# Patient Record
Sex: Male | Born: 1995 | Hispanic: No | Marital: Single
Health system: Northeastern US, Academic
[De-identification: ages and names within clinical notes are randomized; demographics above are authoritative.]

---

## 2014-12-23 ENCOUNTER — Encounter: Payer: Self-pay | Admitting: Emergency Medicine

## 2014-12-23 ENCOUNTER — Emergency Department: Payer: Managed Care, Other (non HMO)

## 2014-12-23 ENCOUNTER — Emergency Department
Admission: EM | Admit: 2014-12-23 | Discharge: 2014-12-23 | Disposition: A | Discharge status: Home / Self Care | Payer: Managed Care, Other (non HMO) | Attending: Emergency Medicine | Admitting: Emergency Medicine

## 2014-12-23 DIAGNOSIS — W01198A Fall on same level from slipping, tripping and stumbling with subsequent striking against other object, initial encounter: Secondary | ICD-10-CM | POA: Insufficient documentation

## 2014-12-23 DIAGNOSIS — Z87891 Personal history of nicotine dependence: Secondary | ICD-10-CM | POA: Diagnosis not present

## 2014-12-23 DIAGNOSIS — Y9289 Other specified places as the place of occurrence of the external cause: Secondary | ICD-10-CM | POA: Insufficient documentation

## 2014-12-23 DIAGNOSIS — S0083XA Contusion of other part of head, initial encounter: Secondary | ICD-10-CM | POA: Insufficient documentation

## 2014-12-23 DIAGNOSIS — Y9389 Activity, other specified: Secondary | ICD-10-CM | POA: Diagnosis not present

## 2014-12-23 DIAGNOSIS — Y998 Other external cause status: Secondary | ICD-10-CM | POA: Insufficient documentation

## 2014-12-23 DIAGNOSIS — S0093XA Contusion of unspecified part of head, initial encounter: Secondary | ICD-10-CM

## 2014-12-23 DIAGNOSIS — S0990XA Unspecified injury of head, initial encounter: Secondary | ICD-10-CM | POA: Diagnosis present

## 2014-12-23 MED ORDER — PROMETHAZINE HCL 25 MG PO TABS
25.0000 mg | ORAL_TABLET | Freq: Four times a day (QID) | ORAL | Status: AC | PRN
Start: 2014-12-23 — End: ?

## 2014-12-23 NOTE — ED Notes (Signed)
Pt states he was drinking last night and had about 5 beers, states he doesn't remember what happened "I walked into the room placed my beer on the table then I think my socks slipped out from under me so I tried to catch my balance and placed my hand on the table and then I fell into the table not really sure how it happened I went to bed shortly after. Pt states he felt sick when it happened and woke up feeling the same

## 2014-12-23 NOTE — Discharge Instructions (Signed)
Head Injury, Adult °You have a head injury. Headaches and throwing up (vomiting) are common after a head injury. It should be easy to wake up from sleeping. Sometimes you must stay in the hospital. Most problems happen within the first 24 hours. Side effects may occur up to 7-10 days after the injury.  °WHAT ARE THE TYPES OF HEAD INJURIES? °Head injuries can be as minor as a bump. Some head injuries can be more severe. More severe head injuries include: °· A jarring injury to the brain (concussion). °· A bruise of the brain (contusion). This mean there is bleeding in the brain that can cause swelling. °· A cracked skull (skull fracture). °· Bleeding in the brain that collects, clots, and forms a bump (hematoma). °WHEN SHOULD I GET HELP RIGHT AWAY?  °· You are confused or sleepy. °· You cannot be woken up. °· You feel sick to your stomach (nauseous) or keep throwing up (vomiting). °· Your dizziness or unsteadiness is getting worse. °· You have very bad, lasting headaches that are not helped by medicine. Take medicines only as told by your doctor. °· You cannot use your arms or legs like normal. °· You cannot walk. °· You notice changes in the black spots in the center of the colored part of your eye (pupil). °· You have clear or bloody fluid coming from your nose or ears. °· You have trouble seeing. °During the next 24 hours after the injury, you must stay with someone who can watch you. This person should get help right away (call 911 in the [place] if you start to shake and are not able to control it (have seizures), you pass out, or you are unable to wake up. °HOW CAN I PREVENT A HEAD INJURY IN THE FUTURE? °· Wear seat belts. °· Wear a helmet while bike riding and playing sports like football. °· Stay away from dangerous activities around the house. °WHEN CAN I RETURN TO NORMAL ACTIVITIES AND ATHLETICS? °See your doctor before doing these activities. You should not do normal activities or play contact sports until 1  week after the following symptoms have stopped: °· Headache that does not go away. °· Dizziness. °· Poor attention. °· Confusion. °· Memory problems. °· Sickness to your stomach or throwing up. °· Tiredness. °· Fussiness. °· Bothered by bright lights or loud noises. °· Anxiousness or depression. °· Restless sleep. °MAKE SURE YOU:  °· Understand these instructions. °· Will watch your condition. °· Will get help right away if you are not doing well or get worse. °  °This information is not intended to replace advice given to you by your health care provider. Make sure you discuss any questions you have with your health care provider. °  °Document Released: 01/30/2008 Document Revised: 03/09/2014 Document Reviewed: 10/24/2012 °Elsevier Interactive Patient Education ©2016 [employer] ° °

## 2014-12-23 NOTE — ED Notes (Signed)
Pt states he was drinking yesterday and fell and hit his head. Pt states today he is nauseous, pt presents alert and oriented, denies vision changes. Pt states he hit his head then went to bed and fell asleep.

## 2014-12-23 NOTE — ED Provider Notes (Signed)
[facility] Emergency Department Provider Note  ____________________________________________  Time seen: Approximately 4:11 PM  I have reviewed the triage vital signs and the nursing notes.   HISTORY  Chief Complaint Head Injury   HPI Rodney Reynolds is a 19 y.o. male who presents for evaluation of head injury. Patient states he was drinking yesterday fell on his head. Immediately went to bed and fell asleep. Patient states that he is feeling very nauseous today.  History reviewed. No pertinent past medical history.  There are no active problems to display for this patient.   History reviewed. No pertinent past surgical history.  Current Outpatient Rx  Name  Route  Sig  Dispense  Refill  . promethazine (PHENERGAN) 25 MG tablet   Oral   Take 1 tablet (25 mg total) by mouth every 6 (six) hours as needed for nausea or vomiting.   30 tablet   0     Allergies Review of patient's allergies indicates no known allergies.  History reviewed. No pertinent family history.  Social History Social History  Substance Use Topics  . Smoking status: Former Games developer  . Smokeless tobacco: Never Used  . Alcohol Use: Yes     Comment: 24 Shatoya Roets/week    Review of Systems Constitutional: No fever/chills Eyes: No visual changes. ENT: Positive for headache Cardiovascular: Denies chest pain. Respiratory: Denies shortness of breath. Gastrointestinal: No abdominal pain.  Positive for nausea, no vomiting.  No diarrhea.  No constipation. Genitourinary: Negative for dysuria. Musculoskeletal: Negative for back pain. Skin: Negative for rash. Neurological: Negative for headaches, focal weakness or numbness.  10-point ROS otherwise negative.  ____________________________________________   PHYSICAL EXAM:  VITAL SIGNS: ED Triage Vitals  Enc Vitals Group     BP 12/23/14 1603 117/68 mmHg     Pulse Rate 12/23/14 1603 94     Resp 12/23/14 1603 18     Temp 12/23/14  1603 98.2 F (36.8 C)     Temp Source 12/23/14 1603 Oral     SpO2 12/23/14 1603 98 %     Weight 12/23/14 1603 130 lb (58.968 kg)     Height 12/23/14 1603  (1.702 m)     Head Cir --      Peak Flow --      Pain Score 12/23/14 1604 6     Pain Loc --      Pain Edu? --      Excl. in GC? --     Constitutional: Alert and oriented. Well appearing and in no acute distress. Eyes: Conjunctivae are normal. PERRL. EOMI. Head: Atraumatic. Nose: No congestion/rhinnorhea. Mouth/Throat: Mucous membranes are moist.  Oropharynx non-erythematous. Neck: No stridor.   Cardiovascular: Normal rate, regular rhythm. Grossly normal heart sounds.  Good peripheral circulation. Respiratory: Normal respiratory effort.  No retractions. Lungs CTAB. Gastrointestinal: Soft and nontender. No distention. No abdominal bruits. No CVA tenderness. Musculoskeletal: No lower extremity tenderness nor edema.  No joint effusions. Neurologic:  Normal speech and language. No gross focal neurologic deficits are appreciated. No gait instability. Skin:  Skin is warm, dry and intact. No rash noted. Psychiatric: Mood and affect are normal. Speech and behavior are normal.  ____________________________________________   LABS (all labs ordered are listed, but only abnormal results are displayed)  Labs Reviewed - No data to display ____________________________________________   RADIOLOGY  Head CT negative for fracture or any intracranial abnormalities/bleed. ____________________________________________   PROCEDURES  Procedure(s) performed: None  Critical Care performed: No  ____________________________________________   INITIAL  IMPRESSION / ASSESSMENT AND PLAN / ED COURSE  Pertinent labs & imaging results that were available during my care of the patient were reviewed by me and considered in my medical decision making (see chart for details).  Status post fall with head contusion. Mild concussion. Reassurance  provided with a negative head CT Rx given for promethazine for nausea. Patient to return to the ER with any worsening or new development of symptomology. ____________________________________________   FINAL CLINICAL IMPRESSION(S) / ED DIAGNOSES  Final diagnoses:  Head contusion, initial encounter      Evangeline DakinCharles M Keyshla Tunison, PA-C 12/23/14 1738  Jennye MoccasinBrian S Quigley, MD 12/24/14 [phone]

## 2017-05-12 IMAGING — CT CT HEAD W/O CM
1 series · 16 of 30 positions shown, 20 images · non-contrast
Comparison: None.

CLINICAL DATA: 19-year-old male with acute headache and nausea
following fall and head injury yesterday.

EXAM:
CT HEAD WITHOUT CONTRAST
TECHNIQUE: Contiguous axial images were obtained from the base of the skull
through the vertex without intravenous contrast.

[Series 2: head wo · axial · 0.44mm/px · z∈[-89,+66]mm · 16 of 36 slices shown, 20 images]
[im 2/36  brain]
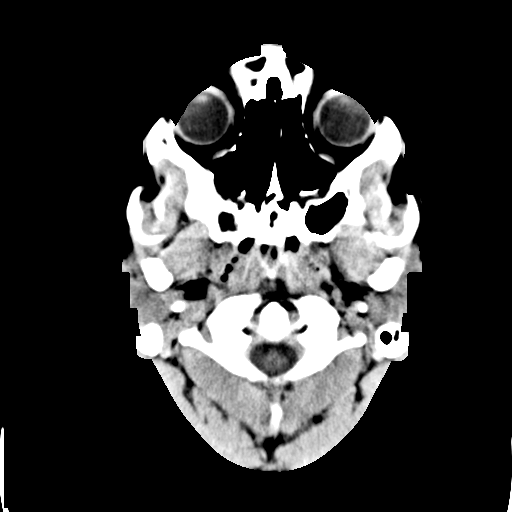
[im 2/36  bone]
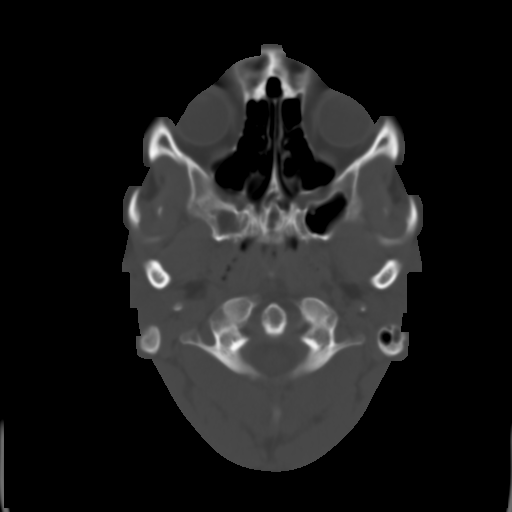
[im 4/36  brain]
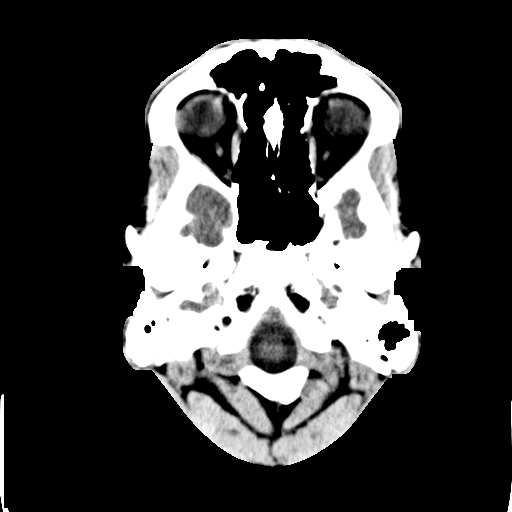
[im 7/36  brain]
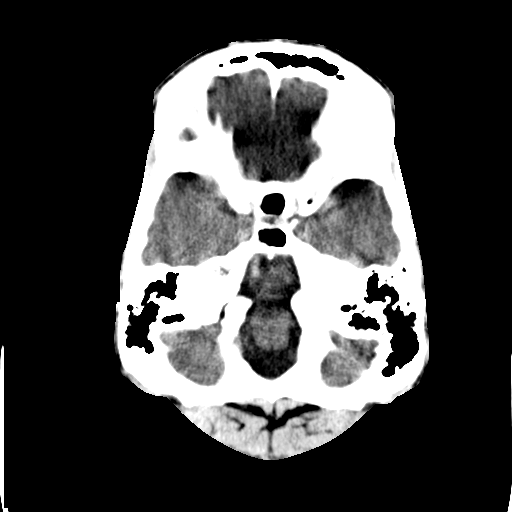
[im 9/36  brain]
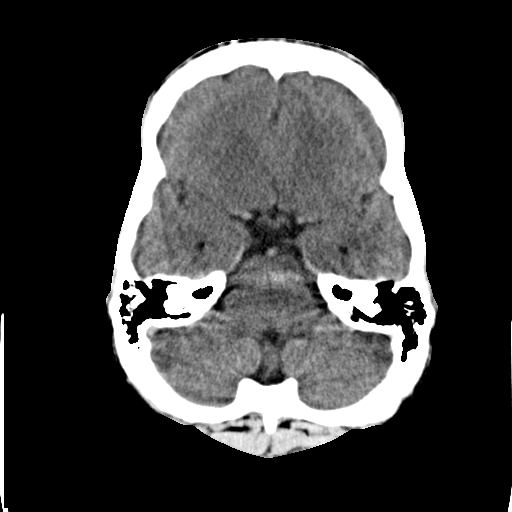
[im 10/36  brain]
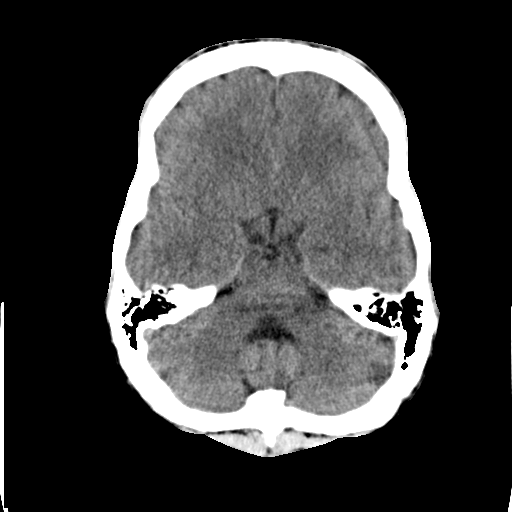
[im 10/36  bone]
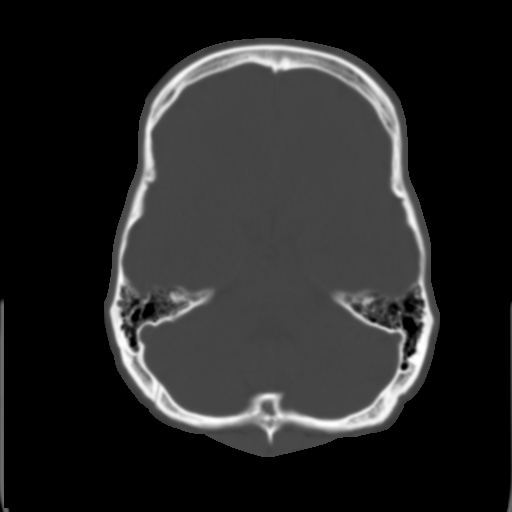
[im 13/36  brain]
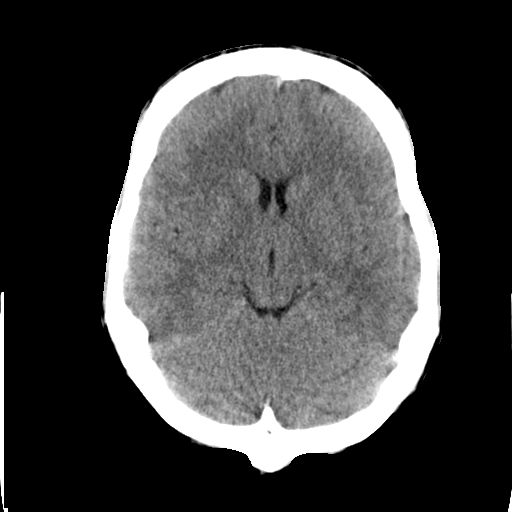
[im 15/36  brain]
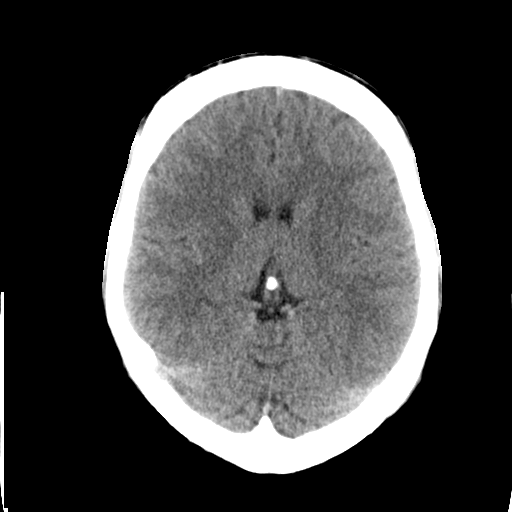
[im 17/36  brain]
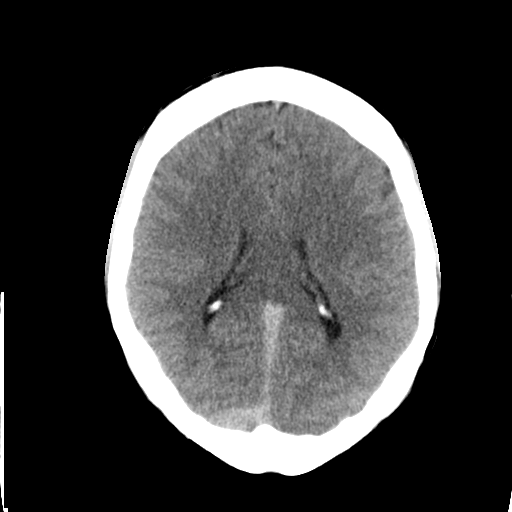
[im 19/36  brain]
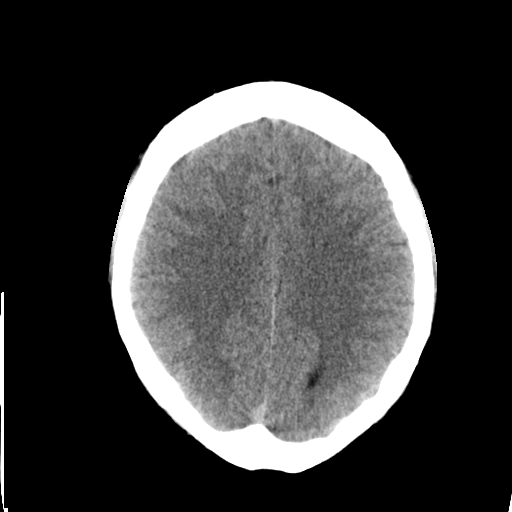
[im 19/36  bone]
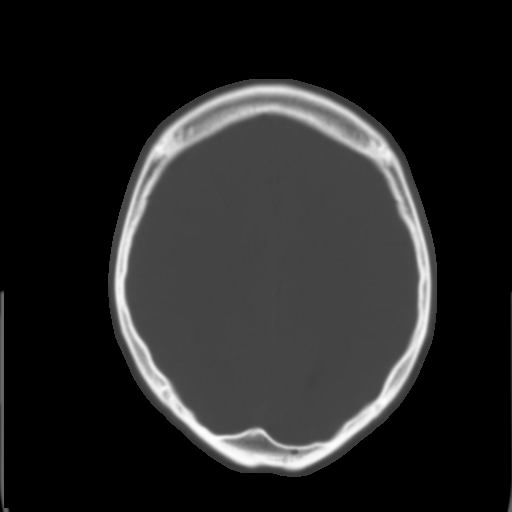
[im 21/36  brain]
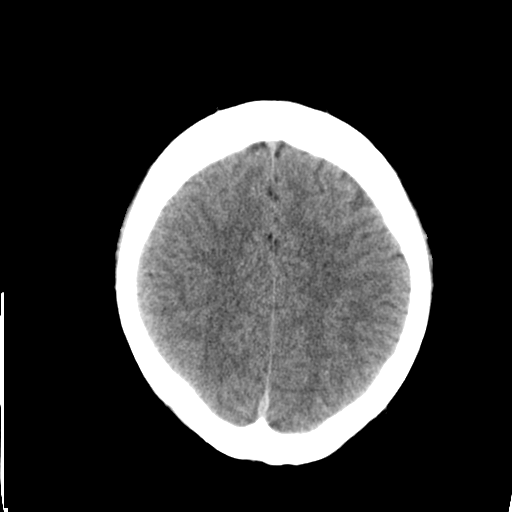
[im 23/36  brain]
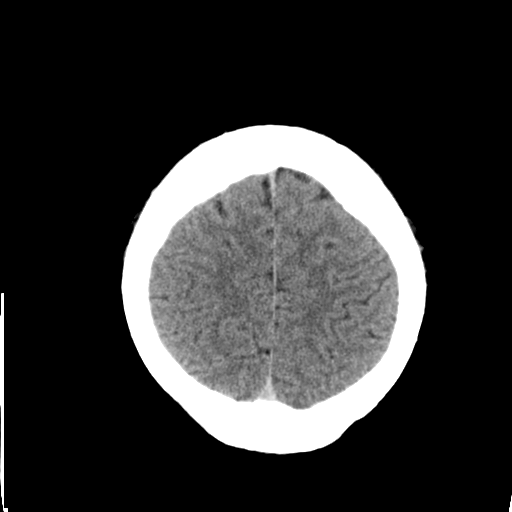
[im 26/36  brain]
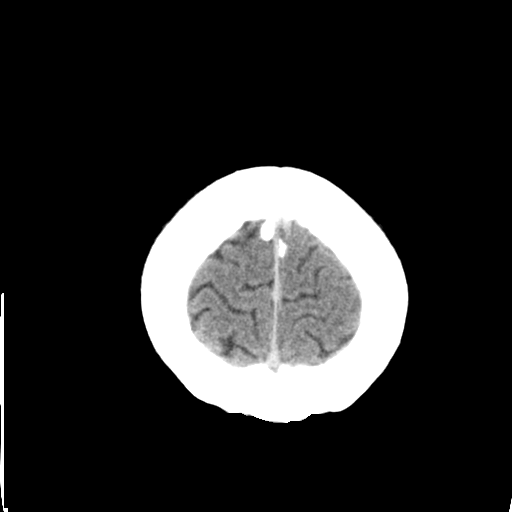
[im 27/36  brain]
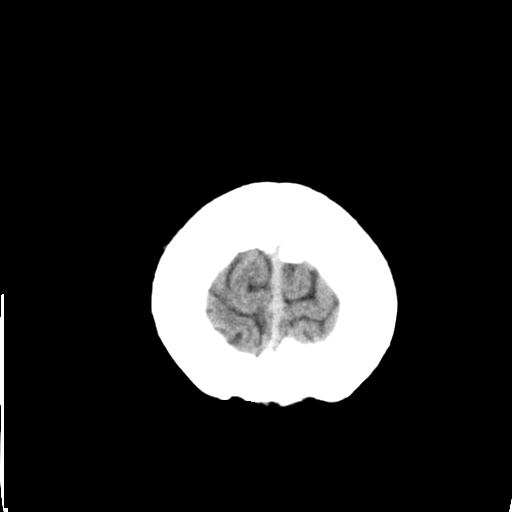
[im 27/36  bone]
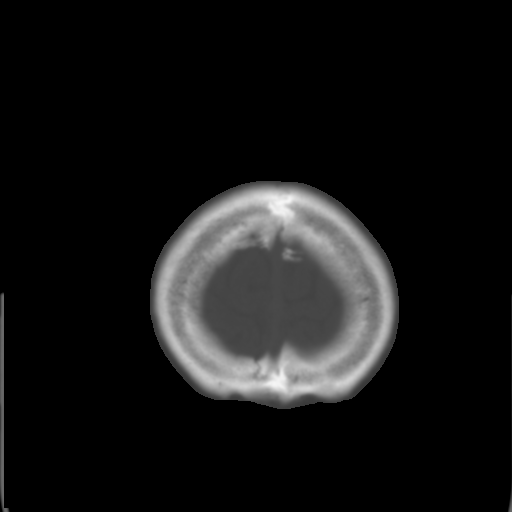
[im 29/36  brain]
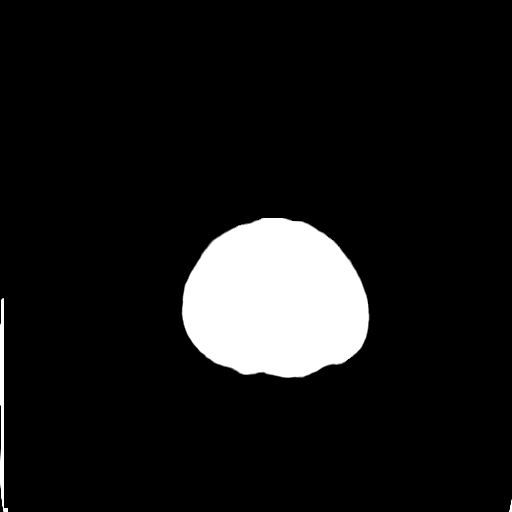
[im 32/36  brain]
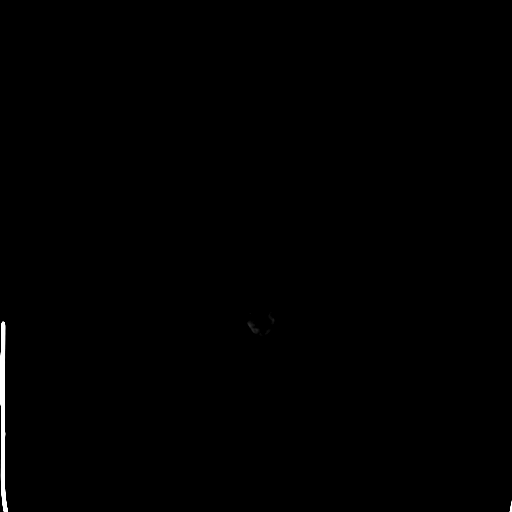
[im 34/36  brain]
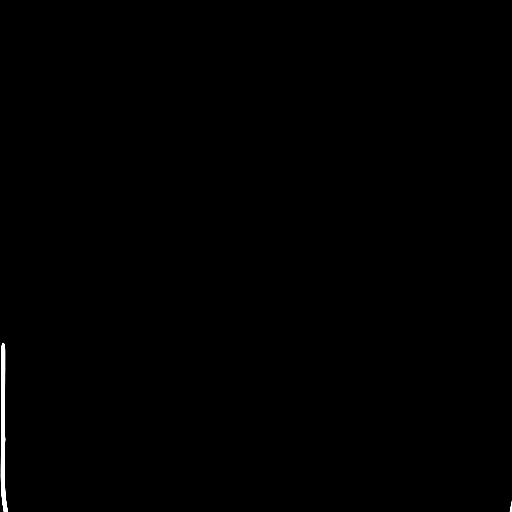

[16 of 30 positions shown; findings below may reference images not displayed]

FINDINGS: No intracranial abnormalities are identified, including mass lesion
or mass effect, hydrocephalus, extra-axial fluid collection, midline
shift, hemorrhage, or acute infarction.

The visualized bony calvarium is unremarkable.
IMPRESSION: Unremarkable noncontrast head CT.

## 2020-03-16 ENCOUNTER — Ambulatory Visit: Admit: 2020-03-16 | Payer: PRIVATE HEALTH INSURANCE

## 2020-03-17 DIAGNOSIS — Z23 Encounter for immunization: Secondary | ICD-10-CM

## 2020-05-22 ENCOUNTER — Emergency Department: Admit: 2020-05-22 | Payer: PRIVATE HEALTH INSURANCE

## 2020-05-22 ENCOUNTER — Inpatient Hospital Stay: Admit: 2020-05-22 | Discharge: 2020-05-22 | Payer: PRIVATE HEALTH INSURANCE

## 2020-05-22 ENCOUNTER — Encounter: Admit: 2020-05-22 | Payer: PRIVATE HEALTH INSURANCE

## 2020-05-22 DIAGNOSIS — Z87891 Personal history of nicotine dependence: Secondary | ICD-10-CM

## 2020-05-22 DIAGNOSIS — R1012 Left upper quadrant pain: Secondary | ICD-10-CM

## 2020-05-22 DIAGNOSIS — F419 Anxiety disorder, unspecified: Secondary | ICD-10-CM

## 2020-05-22 DIAGNOSIS — R569 Unspecified convulsions: Secondary | ICD-10-CM

## 2020-05-22 DIAGNOSIS — G479 Sleep disorder, unspecified: Secondary | ICD-10-CM

## 2020-05-22 DIAGNOSIS — F32A Depression: Secondary | ICD-10-CM

## 2020-05-22 DIAGNOSIS — F909 Attention-deficit hyperactivity disorder, unspecified type: Secondary | ICD-10-CM

## 2020-05-22 DIAGNOSIS — R1013 Epigastric pain: Secondary | ICD-10-CM

## 2020-05-22 DIAGNOSIS — S060XAA Concussion: Secondary | ICD-10-CM

## 2020-05-22 DIAGNOSIS — R110 Nausea: Secondary | ICD-10-CM

## 2020-05-22 LAB — CBC WITH AUTO DIFFERENTIAL
BKR WAM ABSOLUTE IMMATURE GRANULOCYTES.: 0.02 x 1000/ÂµL (ref 0.00–0.30)
BKR WAM ABSOLUTE LYMPHOCYTE COUNT.: 1.59 x 1000/ÂµL (ref 0.60–3.70)
BKR WAM ABSOLUTE NRBC (2 DEC): 0 x 1000/ÂµL (ref 0.00–1.00)
BKR WAM ANALYZER ANC: 4.17 x 1000/ÂµL (ref 2.00–7.60)
BKR WAM BASOPHIL ABSOLUTE COUNT.: 0.04 x 1000/ÂµL (ref 0.00–1.00)
BKR WAM BASOPHILS: 0.6 % (ref 0.0–1.4)
BKR WAM EOSINOPHIL ABSOLUTE COUNT.: 0.04 x 1000/ÂµL (ref 0.00–1.00)
BKR WAM EOSINOPHILS: 0.6 % (ref 0.0–5.0)
BKR WAM HEMATOCRIT (2 DEC): 41.7 % (ref 38.50–50.00)
BKR WAM HEMOGLOBIN: 14.2 g/dL (ref 13.2–17.1)
BKR WAM IMMATURE GRANULOCYTES: 0.3 % (ref 0.0–1.0)
BKR WAM LYMPHOCYTES: 24.5 % (ref 17.0–50.0)
BKR WAM MCH (PG): 29.4 pg (ref 27.0–33.0)
BKR WAM MCHC: 34.1 g/dL (ref 31.0–36.0)
BKR WAM MCV: 86.3 fL (ref 80.0–100.0)
BKR WAM MONOCYTE ABSOLUTE COUNT.: 0.63 x 1000/ÂµL (ref 0.00–1.00)
BKR WAM MONOCYTES: 9.7 % (ref 4.0–12.0)
BKR WAM MPV: 9.5 fL (ref 8.0–12.0)
BKR WAM NEUTROPHILS: 64.3 % (ref 39.0–72.0)
BKR WAM NUCLEATED RED BLOOD CELLS: 0 % (ref 0.0–1.0)
BKR WAM PLATELETS: 254 x1000/ÂµL (ref 150–420)
BKR WAM RDW-CV: 12.8 % (ref 11.0–15.0)
BKR WAM RED BLOOD CELL COUNT.: 4.83 M/ÂµL (ref 4.00–6.00)
BKR WAM WHITE BLOOD CELL COUNT: 6.5 x1000/ÂµL (ref 4.0–11.0)

## 2020-05-22 LAB — LIPASE: BKR LIPASE: 105 U/L (ref 73–393)

## 2020-05-22 LAB — COMPREHENSIVE METABOLIC PANEL
BKR A/G RATIO: 1.6
BKR ALANINE AMINOTRANSFERASE (ALT): 26 U/L (ref 12–78)
BKR ALBUMIN: 4.2 g/dL (ref 3.4–5.0)
BKR ALKALINE PHOSPHATASE: 46 U/L (ref 20–135)
BKR ANION GAP: 7 (ref 5–18)
BKR ASPARTATE AMINOTRANSFERASE (AST): 18 U/L (ref 5–37)
BKR AST/ALT RATIO: 0.7
BKR BILIRUBIN TOTAL: 0.7 mg/dL (ref 0.0–1.0)
BKR BLOOD UREA NITROGEN: 8 mg/dL (ref 8–25)
BKR BUN / CREAT RATIO: 7.4 — ABNORMAL LOW (ref 8.0–25.0)
BKR CALCIUM: 9.3 mg/dL (ref 8.4–10.3)
BKR CHLORIDE: 106 mmol/L (ref 95–115)
BKR CO2: 25 mmol/L (ref 21–32)
BKR CREATININE: 1.08 mg/dL (ref 0.50–1.30)
BKR EGFR (AFR AMER): >60 mL/min/{1.73_m2} (ref >60)
BKR EGFR (NON AFRICAN AMERICAN): >60 mL/min/{1.73_m2} (ref >60)
BKR GLOBULIN: 2.7 g/dL
BKR GLUCOSE: 95 mg/dL (ref 70–100)
BKR OSMOLALITY CALCULATION: 274 mosm/kg — ABNORMAL LOW (ref 275–295)
BKR POTASSIUM: 3.9 mmol/L (ref 3.5–5.1)
BKR PROTEIN TOTAL: 6.9 g/dL (ref 6.4–8.2)
BKR SODIUM: 138 mmol/L (ref 136–145)

## 2020-05-22 LAB — ZZZURINALYSIS WITH CULTURE REFLEX     (L Q)
BKR BILIRUBIN, UA: NEGATIVE
BKR BLOOD, UA: NEGATIVE
BKR GLUCOSE, UA: NEGATIVE
BKR LEUKOCYTE ESTERASE, UA: NEGATIVE
BKR NITRITE, UA: NEGATIVE
BKR PH, UA: 6.5 (ref 5.5–7.5)
BKR PROTEIN, UA: NEGATIVE
BKR SPECIFIC GRAVITY, UA: 1.007 (ref 1.005–1.030)
BKR UROBILINOGEN, UA: <2 EU/dL (ref <=2.0)

## 2020-05-22 LAB — C-REACTIVE PROTEIN     (CRP): BKR C-REACTIVE PROTEIN: <0.3 mg/dL (ref 0.0–1.0)

## 2020-05-22 LAB — PROCALCITONIN     (BH GH LMW Q YH): BKR PROCALCITONIN: 0.09 ng/mL

## 2020-05-22 MED ORDER — SODIUM CHLORIDE 0.9 % BOLUS (NEW BAG)
0.9 % | Freq: Once | INTRAVENOUS | Status: DC
Start: 2020-05-22 — End: 2020-05-22

## 2020-05-22 MED ORDER — SODIUM CHLORIDE 0.9 % BOLUS (NEW BAG)
0.9 % | Freq: Once | INTRAVENOUS | Status: CP
Start: 2020-05-22 — End: ?
  Administered 2020-05-22: 15:00:00 0.9 mL/h via INTRAVENOUS

## 2020-05-22 MED ORDER — PANTOPRAZOLE IV PUSH 40 MG VIAL & NS (ADULTS)
Freq: Once | INTRAVENOUS | Status: CP
Start: 2020-05-22 — End: ?
  Administered 2020-05-22: 15:00:00 10.000 mL via INTRAVENOUS

## 2020-05-22 MED ORDER — ONDANSETRON 4 MG DISINTEGRATING TABLET
4 mg | ORAL_TABLET | Freq: Three times a day (TID) | 1 refills | Status: AC | PRN
Start: 2020-05-22 — End: ?

## 2020-05-22 MED ORDER — IOHEXOL 350 MG IODINE/ML INTRAVENOUS SOLUTION
350 mg iodine/mL | Freq: Once | INTRAVENOUS | Status: CP | PRN
Start: 2020-05-22 — End: ?
  Administered 2020-05-22: 16:00:00 350 mL via INTRAVENOUS

## 2020-05-22 MED ORDER — DICYCLOMINE 10 MG CAPSULE
10 mg | Freq: Once | ORAL | Status: CP
Start: 2020-05-22 — End: ?
  Administered 2020-05-22: 17:00:00 10 mg via ORAL

## 2020-05-22 MED ORDER — ONDANSETRON HCL (PF) 4 MG/2 ML INJECTION SOLUTION
4 mg/2 mL | Freq: Once | INTRAVENOUS | Status: CP
Start: 2020-05-22 — End: ?
  Administered 2020-05-22: 15:00:00 4 mL via INTRAVENOUS

## 2020-05-22 MED ORDER — DICYCLOMINE 20 MG TABLET
20 mg | ORAL_TABLET | Freq: Two times a day (BID) | ORAL | 1 refills | Status: AC | PRN
Start: 2020-05-22 — End: ?

## 2020-05-22 MED ORDER — PANTOPRAZOLE 40 MG TABLET,DELAYED RELEASE
40 mg | ORAL_TABLET | Freq: Every day | ORAL | 1 refills | Status: AC
Start: 2020-05-22 — End: ?

## 2020-05-22 NOTE — ED Notes
10:52 AM  - Oral contrast start time

## 2020-05-22 NOTE — ED Notes
11:06 AM -Pt here for c/o abdominal pain since yesterday,pt referring to LUQ area,Pt AOx4 anxious,denies of SOB,CP however pt  Calm,admits he drinks alcohol and last drink was 6-6 weeks ago., pt denies headache,slight nausea and abdominal pain,Lab draws collected and sent,IV Zofran and Pantoprazole and NS bolus all given by Rn-Jenn,pt was advised to collect urine sample,Call light with in reached.wctm.

## 2020-05-22 NOTE — Discharge Instructions
Protonix and bentyl as prescribed  Avoid motrin/alleve ibuprofen Return to er if pain worse or lightheaded Or black loose stool See own md and gi md  for follow up

## 2020-05-22 NOTE — ED Notes
2:06 PM -D/C instructions provided to both pt and mom,Both verbalizes understanding,Pt concerned about his abdominal pain,Education regarding diet and importance of ff up apt. With GI/gastrointerologist.VSS,pt denies of abdominal pain.PIV was taken out,Both left unit ,Safety maintained.

## 2020-05-23 NOTE — Telephone Encounter
Still a little discomfort but I do have my medicine to help and I will f/u with the GI doctor and thanks for all your help

## 2020-05-27 NOTE — ED Provider Notes
? 2014 CD-Notes ?  All Rights [facility] Emergency Department	Chief Complaint:Chief Complaint Patient presents with ? Abdominal Pain   c/o mid to L sided abd pain. Started 1 week ago. +nausea, states has poor PO intake   ZOX:WRUEAVW Cimo, 25 y.o., male, presents with complaints of LUQ and epigastric pain. Pt reports worsening pain about 10-15 minutes after eating. Pt denies recent EtOH intake. He denies fevers, chills, nausea, vomiting, diarrhea, chest pain, back pain, shortness of breath, urinary symptoms, or any other acute complaints. Historian: patientReview of Systems:	Constitution: 	Denies: fever, chills, weakness, fatigue	HENT:		Denies: sore throat, nasal congestion, nasal discharge	EYES:		Denies: blurry vision, decreased vision	RESP:		Denies: cough, shortness of breath	CARDIO:	Denies: chest pain, dyspnea on exertion, palpitations	GI:		+abdominal pain; Denies: nausea, vomiting, diarrhea	GU:		Denies: dysuria	MUSC:		Denies: back pain, joint pain	SKIN:		Denies: rash	NEURO:	Denies: headache, numbness/tinglingPast Medical History:Past Medical History: Diagnosis Date ? ADHD  ? Anxiety  ? Concussion   age 48 - hit head accidentally with lid of trunk; age 67 - fell while inebriated in college and hit head ? Depression  ? Difficulty sleeping  ? Seizure (HC Code) [facility] CODE)  Past Surgical History: Procedure Laterality Date ? 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 Also in mid 2017 had normal EGD for stomach issues ? NOSE SURGERY    age 77 for broken nose Social History:Social History Tobacco Use ? Smoking status: Former Smoker ? Smokeless tobacco: Never Used ? Tobacco comment: quit August 2017 Substance Use Topics ? Alcohol use: Yes   Comment: social drinker ? Drug use: Yes   Types: Marijuana Family History:Family History Problem Relation Age of Onset ? Deep vein thrombosis Mother  Medications: Medication List  START taking these medications  dicyclomine 20 mg tabletCommonly known as: BENTYLTake 1 tablet (20 mg total) by mouth 2 (two) times daily as needed. ondansetron 4 mg disintegrating tabletCommonly known as: ZOFRAN-ODTPlace 1 tablet (4 mg total) onto the tongue every 8 (eight) hours as needed for nausea for up to 7 days. pantoprazole 40 mg tabletCommonly known as: PROTONIXTake 1 tablet (40 mg total) by mouth daily.  ASK your doctor about these medications  dextroamphetamine-amphetamine XR 10 mg 24 hr capsuleCommonly known as: ADDERALL XR   Where to Get Your Medications  You can get these medications from any pharmacy  Bring a paper prescription for each of these medications?	dicyclomine 20 mg tablet?	ondansetron 4 mg disintegrating tablet?	pantoprazole 40 mg tablet Allergies as of 05/22/2020 ? (No Known Allergies) Physical Exam:  vital signs were reviewedBP 126/70  - Pulse 77  - Temp 98.2 ?F (36.8 ?C) (Oral)  - Resp 16  - Wt 66.8 kg  - SpO2 100%  - BMI 23.07 kg/m? General Appear:	Awake, alert, in no acute distressEars/Nose/Throat:	mucosa moist, pharynx clearEyes:			PERRL, anictericCardiovascular:	regular rate and rhythm, no murmurRespiratory:		clear to auscultation bilaterally without wheeze, rales or rhonchiAbdomen:		Soft, +epigastric and LUQ tenderness to palpation, no rebound of guarding. No CVA tenderness Musculoskeletal:	no joint tendernessSkin:			no rash, skin dryNeurologic:		alert and oriented x3	Medical Decision Making:Nursing notes were reviewed: YesI reviewed the following historical information: medical recordsLaboratory studies: YesResults for orders placed or performed during the hospital encounter of 05/22/20 Lipase Result Value Ref Range  Lipase 105 73 - 393 U/L Urinalysis with culture reflex  Specimen: Urine Result Value Ref Range  Clarity, UA Clear Clear  Color, UA Yellow Yellow  Specific Gravity, UA 1.007 1.005 - 1.030  pH, UA 6.5 5.5 - 7.5  Protein, UA Negative Negative-Trace  Glucose, UA Negative Negative  Ketones, UA Trace Negative  Blood, UA Negative Negative  Bilirubin, UA Negative Negative  Leukocytes,  UA Negative Negative  Nitrite, UA Negative Negative  Urobilinogen, UA <2.0 <=2.0 EU/dL CBC auto differential Result Value Ref Range  WBC 6.5 4.0 - 11.0 x1000/?L  RBC 4.83 4.00 - 6.00 M/?L  Hemoglobin 14.2 13.2 - 17.1 g/dL  Hematocrit 09.81 19.14 - 50.00 %  MCV 86.3 80.0 - 100.0 fL  MCH 29.4 27.0 - 33.0 pg  MCHC 34.1 31.0 - 36.0 g/dL  RDW-CV 78.2 95.6 - 21.3 %  Platelets 254 150 - 420 x1000/?L  MPV 9.5 8.0 - 12.0 fL  Neutrophils 64.3 39.0 - 72.0 %  Lymphocytes 24.5 17.0 - 50.0 %  Monocytes 9.7 4.0 - 12.0 %  Eosinophils 0.6 0.0 - 5.0 %  Basophil 0.6 0.0 - 1.4 %  Immature Granulocytes 0.3 0.0 - 1.0 %  nRBC 0.0 0.0 - 1.0 %  ANC(Abs Neutrophil Count) 4.17 2.00 - 7.60 x 1000/?L  Absolute Lymphocyte Count 1.59 0.60 - 3.70 x 1000/?L  Monocyte Absolute Count 0.63 0.00 - 1.00 x 1000/?L  Eosinophil Absolute Count 0.04 0.00 - 1.00 x 1000/?L  Basophil Absolute Count 0.04 0.00 - 1.00 x 1000/?L  Absolute Immature Granulocyte Count 0.02 0.00 - 0.30 x 1000/?L  Absolute nRBC 0.00 0.00 - 1.00 x 1000/?L Comprehensive metabolic panel Result Value Ref Range  Sodium 138 136 - 145 mmol/L  Potassium 3.9 3.5 - 5.1 mmol/L  Chloride 106 95 - 115 mmol/L  CO2 25 21 - 32 mmol/L  Anion Gap 7 5 - 18  Glucose 95 70 - 100 mg/dL  BUN 8 8 - 25 mg/dL  Creatinine 0.86 5.78 - 1.30 mg/dL  Calcium 9.3 8.4 - 46.9 mg/dL  BUN/Creatinine Ratio 7.4 (L) 8.0 - 25.0  Total Protein 6.9 6.4 - 8.2 g/dL  Albumin 4.2 3.4 - 5.0 g/dL  Total Bilirubin 0.7 0.0 - 1.0 mg/dL  Alkaline Phosphatase 46 20 - 135 U/L Alanine Aminotransferase (ALT) 26 12 - 78 U/L  Aspartate Aminotransferase (AST) 18 5 - 37 U/L  Globulin 2.7 g/dL  A/G Ratio 1.6   AST/ALT Ratio 0.7 See Comment  eGFR (Afr Amer) >60 >60 mL/min/1.47m2  eGFR (NON African-American) >60 >60 mL/min/1.58m2  Osmolality Calculation 274 (L) 275 - 295 mOsm/kg Procalcitonin     (BH GH LMW Q YH) Result Value Ref Range  Procalcitonin 0.09 See Comment ng/mL C-reactive protein Result Value Ref Range  C-Reactive Protein <0.3 0.0 - 1.0 mg/dL Radiologic Imaging Studies:[place] Abdomen Pelvis w IV Contrast (Oral) Final Result   No specific findings in the abdomen or pelvis. Degenerative change of the lower thoracic spine.  Individual dose optimization technique was used for the performance of this procedure, and one or more of the following dose reduction techniques was used: Automated exposure control and/or adjustment of the MA and/or kV according to patient size and/or the use of iterative reconstruction technique.  Reported and Signed by:  Geroge Baseman, MD   Independently interpreted by ED provider: labs/RadED Course/Assessment/Plan:Barry Edwards, 25 y.o., male, with LUQ and epigastric pain. Will check labs, [place] Abd/Pel, and reassess. IV Fluids, Zofran, and Protonix given. Labs and imaging wnl. Results reviewed and discussed with the pt. Pt given Bentyl 20 mg PO in the ED.Pt will be discharged with rx for Bentyl, Zofran, and Protonix. Encouraged close PMD follow-up. Return instructions given.I have discussed the discharge plan with the patient. The patient agrees and understands with the plan as discussed. The patient understands the Emergency department diagnosis is a preliminary diagnosis often based on limited information and that the patient must adhere  to the follow up plan as discussed. The patient understands that if the symptoms worsen or if prescribed medications do not have the desired/planned effect that the patient may return to the Emergency Department at any time for further evaluation and treatment.______________________________________________________________________Diagnosis: The encounter diagnosis was Left upper quadrant abdominal pain. Disposition: HomeCondition: StableBy signing my name below, I, French Ana, attest that this documentation has been prepared under the direction and in the presence of de [place], [place]* Electronically Signed: French Ana, scribe. 05/22/2020. 10:11 AM.I, de [place], [place]*, personally performed the services described in this documentation. All medical record entries made by the scribe were at my direction and in my presence. I have reviewed the chart and discharge instructions and agree that the record reflects my personal performance and is accurate and complete. de [place], [place]*. 05/22/2020. 10:11 AM.?2014 CD-Notes?  LLC All Rights Reserved; version 2.0; revised November, 2018.[email or web address] de [address] [place] A, MD03/28/22 [id number]
